# Patient Record
Sex: Female | Born: 1965 | Race: Black or African American | Hispanic: No | Marital: Single | Smoking: Current every day smoker
Health system: Southern US, Community
[De-identification: ages and names within clinical notes are randomized; demographics above are authoritative.]

## PROBLEM LIST (undated history)

## (undated) DIAGNOSIS — C539 Malignant neoplasm of cervix uteri, unspecified: Secondary | ICD-10-CM

## (undated) DIAGNOSIS — I671 Cerebral aneurysm, nonruptured: Secondary | ICD-10-CM

## (undated) HISTORY — PX: BRAIN SURGERY: SHX531

## (undated) HISTORY — PX: ABDOMINAL HYSTERECTOMY: SHX81

---

## 2011-09-29 ENCOUNTER — Encounter (HOSPITAL_BASED_OUTPATIENT_CLINIC_OR_DEPARTMENT_OTHER): Payer: Self-pay | Admitting: *Deleted

## 2011-09-29 ENCOUNTER — Emergency Department (INDEPENDENT_AMBULATORY_CARE_PROVIDER_SITE_OTHER): Payer: Self-pay

## 2011-09-29 ENCOUNTER — Emergency Department (HOSPITAL_BASED_OUTPATIENT_CLINIC_OR_DEPARTMENT_OTHER)
Admission: EM | Admit: 2011-09-29 | Discharge: 2011-09-29 | Disposition: A | Discharge status: Home / Self Care | Payer: Self-pay | Attending: Emergency Medicine | Admitting: Emergency Medicine

## 2011-09-29 DIAGNOSIS — R059 Cough, unspecified: Secondary | ICD-10-CM | POA: Insufficient documentation

## 2011-09-29 DIAGNOSIS — R509 Fever, unspecified: Secondary | ICD-10-CM

## 2011-09-29 DIAGNOSIS — J111 Influenza due to unidentified influenza virus with other respiratory manifestations: Secondary | ICD-10-CM

## 2011-09-29 DIAGNOSIS — J40 Bronchitis, not specified as acute or chronic: Secondary | ICD-10-CM

## 2011-09-29 DIAGNOSIS — Z79899 Other long term (current) drug therapy: Secondary | ICD-10-CM | POA: Insufficient documentation

## 2011-09-29 DIAGNOSIS — R05 Cough: Secondary | ICD-10-CM

## 2011-09-29 HISTORY — DX: Malignant neoplasm of cervix uteri, unspecified: C53.9

## 2011-09-29 HISTORY — DX: Cerebral aneurysm, nonruptured: I67.1

## 2011-09-29 MED ORDER — IBUPROFEN 600 MG PO TABS: 600.0000 mg | ORAL_TABLET | Freq: Four times a day (QID) | ORAL | Status: AC | PRN

## 2011-09-29 MED ORDER — AZITHROMYCIN 250 MG PO TABS: 250.0000 mg | ORAL_TABLET | Freq: Every day | ORAL | Status: AC

## 2011-09-29 MED ORDER — OXYMETAZOLINE HCL 0.05 % NA SOLN: 2.0000 | Freq: Two times a day (BID) | NASAL | Status: AC

## 2011-09-29 MED ORDER — ALBUTEROL SULFATE HFA 108 (90 BASE) MCG/ACT IN AERS: 1.0000 | INHALATION_SPRAY | Freq: Four times a day (QID) | RESPIRATORY_TRACT | Status: DC | PRN

## 2011-09-29 MED ORDER — IBUPROFEN 800 MG PO TABS
800.0000 mg | ORAL_TABLET | Freq: Once | ORAL | Status: AC
  Administered 2011-09-29: 800 mg via ORAL
  Filled 2011-09-29: qty 1

## 2011-09-29 NOTE — ED Provider Notes (Signed)
History   This chart was scribed for Forbes Cellar, MD by Charolett Bumpers . The patient was seen in room MH11/MH11 and the patient's care was started at 4:11pm.    CSN: [id number]  Arrival date & time 09/29/11  1341   First MD Initiated Contact with Patient 09/29/11 1604      Chief Complaint  Patient presents with  . Cough  . Nasal Congestion    (Consider location/radiation/quality/duration/timing/severity/associated sxs/prior treatment) HPI Courtney Cunningham is a 46 y.o. female who presents to the Emergency Department complaining of constant, moderate productive cough with green/yellowish phlegm for the past 3 days. Patient reports associated congestion, subjective fever, chills, headache, neck stiffness, n/v/d, dizziness and body aches. Patient reports that the body aches started yesterday and are described as 6/10. All other sx began 3 days ago. Patient notes that she took Phenergan for the n/v, and OTC medications (Mucinex)  for her cough and congestion. Patient denies vision changes and dysuria. Patient also notes that she did not receive a flu shot this season. +sick contacts at home.    Past Medical History  Diagnosis Date  . Cervical cancer   . Cervical cancer   . Brain aneurysm     Past Surgical History  Procedure Date  . Brain surgery   . Abdominal hysterectomy     No family history on file.  History  Substance Use Topics  . Smoking status: Current Everyday Smoker -- 1.0 packs/day  . Smokeless tobacco: Never Used  . Alcohol Use: No    OB History    Grav Para Term Preterm Abortions TAB SAB Ect Mult Living                  Review of Systems A complete 10 system review of systems was obtained and is otherwise negative except as noted in the HPI and PMH.   Allergies  Penicillins  Home Medications   Current Outpatient Rx  Name Route Sig Dispense Refill  . VITAMIN C PO Oral Take 1 tablet by mouth daily.    . CHLORPHEN-PE-ACETAMINOPHEN 2-5-500 MG PO  TABS Oral Take 2 tablets by mouth 4 (four) times daily.    . ADULT MULTIVITAMIN W/MINERALS CH Oral Take 1 tablet by mouth daily.    Marland Kitchen PHENYLEPHRINE-APAP-GUAIFENESIN 10-650-400 MG/20ML PO LIQD Oral Take 20 mLs by mouth every 4 (four) hours as needed. For cold symptoms    . TRUBIOTICS PO CAPS Oral Take 1 capsule by mouth daily.    . ALBUTEROL SULFATE HFA 108 (90 BASE) MCG/ACT IN AERS Inhalation Inhale 1-2 puffs into the lungs every 6 (six) hours as needed for wheezing. 1 Inhaler 0  . AZITHROMYCIN 250 MG PO TABS Oral Take 1 tablet (250 mg total) by mouth daily. Take first 2 tablets together, then 1 every day until finished. 6 tablet 0  . IBUPROFEN 600 MG PO TABS Oral Take 1 tablet (600 mg total) by mouth every 6 (six) hours as needed for pain. 30 tablet 0  . OXYMETAZOLINE HCL 0.05 % NA SOLN Nasal Place 2 sprays into the nose 2 (two) times daily. 30 mL 0    BP 146/77  Pulse 83  Temp(Src) 98.3 F (36.8 C) (Oral)  Resp 16  Ht 5' (1.524 m)  Wt 140 lb (63.504 kg)  BMI 27.34 kg/m2  SpO2 98%  Physical Exam  Nursing note and vitals reviewed. Constitutional: She is oriented to person, place, and time. She appears well-developed and well-nourished. No distress.  HENT:  Head: Normocephalic and atraumatic.  Right Ear: External ear normal.  Left Ear: External ear normal.       TMs normal bilaterally. Mucous membranes are normal.   Eyes: EOM are normal. Pupils are equal, round, and reactive to light.  Neck: Neck supple. No tracheal deviation present.  Cardiovascular: Normal rate, regular rhythm and normal heart sounds.  Exam reveals no gallop and no friction rub.   No murmur heard. Pulmonary/Chest: Effort normal and breath sounds normal. No respiratory distress. She exhibits no tenderness.       Lungs are clear bilaterally.   Abdominal: Soft. Bowel sounds are normal. She exhibits no distension and no mass. There is no tenderness. There is no rebound and no guarding.       No CVA tenderness.     Musculoskeletal: Normal range of motion. She exhibits no edema.  Neurological: She is alert and oriented to person, place, and time. No sensory deficit.  Skin: Skin is warm and dry.  Psychiatric: She has a normal mood and affect. Her behavior is normal.    ED Course  Procedures (including critical care time)   COORDINATION OF CARE:  1630: Medication Orders: Ibuprofen tablet 800 mg.    Labs Reviewed - No data to display Dg Chest 2 View  09/29/2011  *RADIOLOGY REPORT*  Clinical Data: Cough and fever  CHEST - 2 VIEW  Comparison: None  Findings: The heart size and mediastinal contours are within normal limits.  Both lungs are clear.  The visualized skeletal structures are unremarkable.  IMPRESSION: No active cardiopulmonary abnormalities.  Original Report Authenticated By: Rosealee Albee, M.D.     1. Influenza-like illness   2. Bronchitis     MDM  Patient pw flu like illness, worsening cough/chest congestion. No wheezing at this time. CXR negative pna. Azithromycin, albuterol for cough, afrin nasal spray. Sx > 3 days and patient appears well. Immunocompetent. No tamiflu prescribed at this time. Will discharge home with PMD referral. Patient comfortable with the plan. Given precautions for return.  I personally performed the services described in this documentation, which was scribed in my presence. The recorded information has been reviewed and considered.       Forbes Cellar, MD 09/29/11 [phone]

## 2011-09-29 NOTE — ED Notes (Signed)
C/o cough, congestion, headache and chills x 3 days

## 2013-01-19 IMAGING — CR DG CHEST 2V
2 series · 2 of 2 positions shown · non-contrast
Comparison: None

CLINICAL DATA: Cough and fever

CHEST - 2 VIEW

[w chest pa]
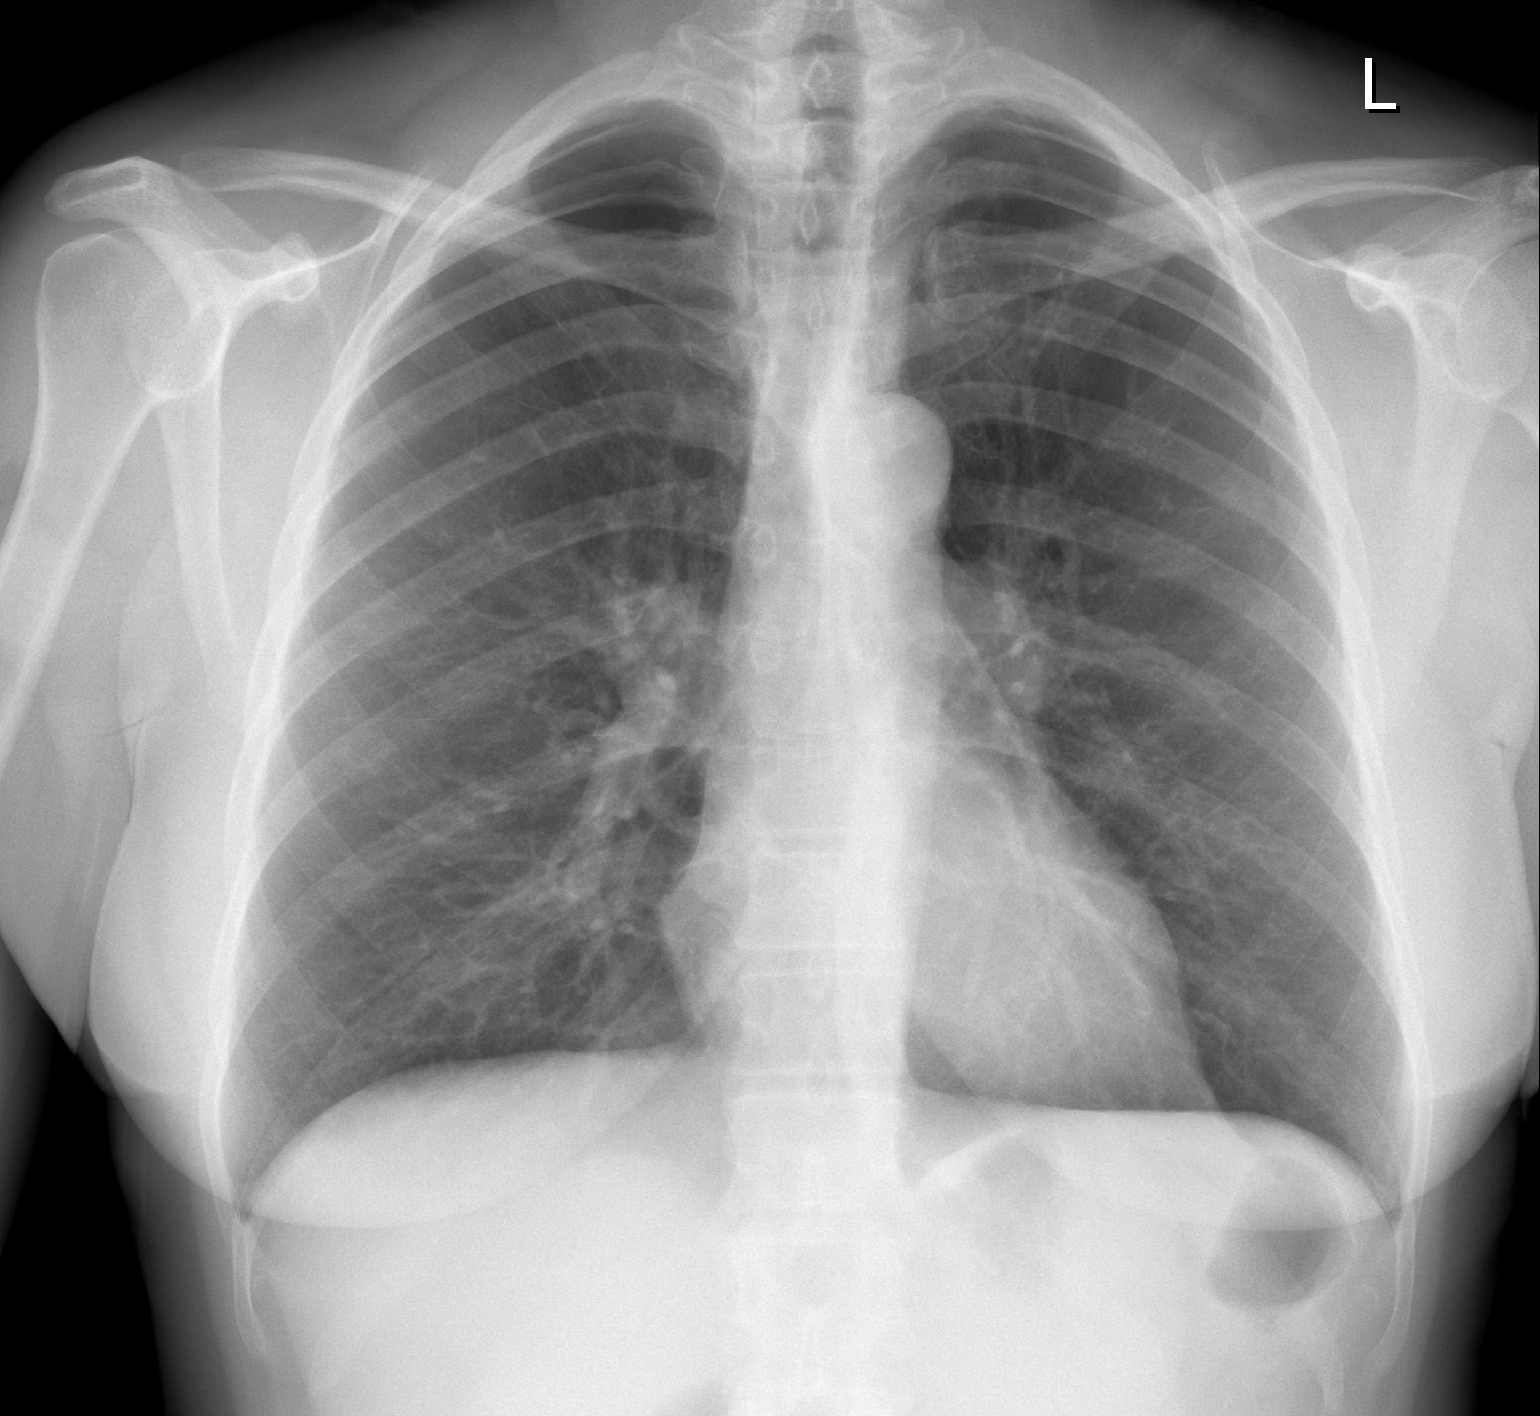

[w chest lat]
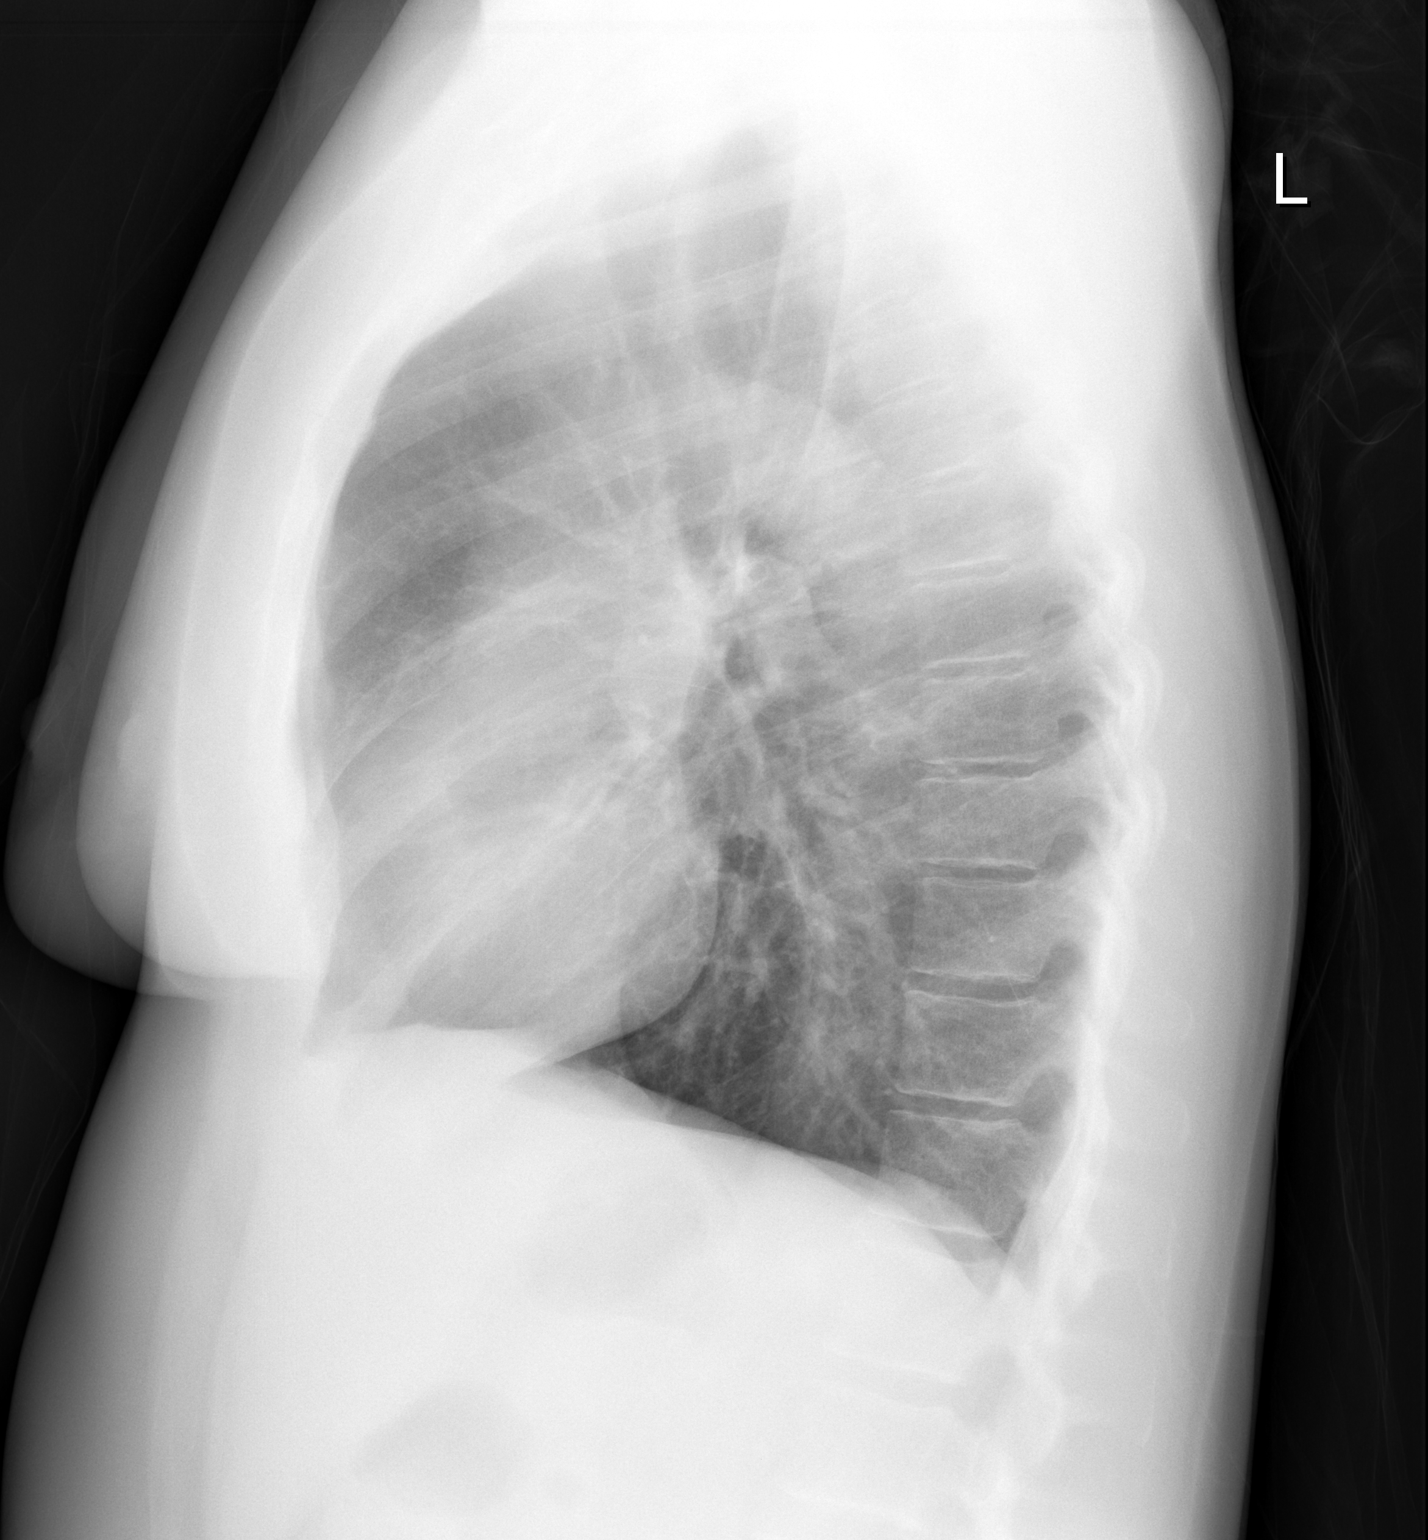

[2 of 2 positions shown; findings below may reference images not displayed]

FINDINGS: The heart size and mediastinal contours are within normal
limits.  Both lungs are clear.  The visualized skeletal structures
are unremarkable.
IMPRESSION: No active cardiopulmonary abnormalities.

## 2013-11-10 ENCOUNTER — Encounter (HOSPITAL_COMMUNITY): Payer: Self-pay | Admitting: Emergency Medicine

## 2013-11-10 ENCOUNTER — Emergency Department (HOSPITAL_COMMUNITY)
Admission: EM | Admit: 2013-11-10 | Discharge: 2013-11-10 | Disposition: A | Discharge status: Home / Self Care | Payer: No Typology Code available for payment source | Attending: Emergency Medicine | Admitting: Emergency Medicine

## 2013-11-10 DIAGNOSIS — Z8541 Personal history of malignant neoplasm of cervix uteri: Secondary | ICD-10-CM | POA: Insufficient documentation

## 2013-11-10 DIAGNOSIS — R638 Other symptoms and signs concerning food and fluid intake: Secondary | ICD-10-CM | POA: Insufficient documentation

## 2013-11-10 DIAGNOSIS — Z79899 Other long term (current) drug therapy: Secondary | ICD-10-CM | POA: Insufficient documentation

## 2013-11-10 DIAGNOSIS — R5381 Other malaise: Secondary | ICD-10-CM | POA: Insufficient documentation

## 2013-11-10 DIAGNOSIS — Z880 Allergy status to penicillin: Secondary | ICD-10-CM | POA: Insufficient documentation

## 2013-11-10 DIAGNOSIS — R5383 Other fatigue: Secondary | ICD-10-CM

## 2013-11-10 DIAGNOSIS — R634 Abnormal weight loss: Secondary | ICD-10-CM | POA: Insufficient documentation

## 2013-11-10 DIAGNOSIS — F17200 Nicotine dependence, unspecified, uncomplicated: Secondary | ICD-10-CM | POA: Insufficient documentation

## 2013-11-10 LAB — CBG MONITORING, ED: Glucose-Capillary: 85 mg/dL (ref 70–99)

## 2013-11-10 LAB — BASIC METABOLIC PANEL
BUN: 13 mg/dL (ref 6–23)
CALCIUM: 10.2 mg/dL (ref 8.4–10.5)
CO2: 27 meq/L (ref 19–32)
Chloride: 102 mEq/L (ref 96–112)
Creatinine, Ser: 0.74 mg/dL (ref 0.50–1.10)
GFR calc Af Amer: >90 mL/min (ref >90)
Glucose, Bld: 90 mg/dL (ref 70–99)
Potassium: 4.6 mEq/L (ref 3.7–5.3)
SODIUM: 141 meq/L (ref 137–147)

## 2013-11-10 LAB — CBC
HCT: 41.3 % (ref 36.0–46.0)
Hemoglobin: 14.3 g/dL (ref 12.0–15.0)
MCH: 29.9 pg (ref 26.0–34.0)
MCHC: 34.6 g/dL (ref 30.0–36.0)
MCV: 86.4 fL (ref 78.0–100.0)
PLATELETS: 308 10*3/uL (ref 150–400)
RBC: 4.78 MIL/uL (ref 3.87–5.11)
RDW: 13.1 % (ref 11.5–15.5)
WBC: 6.8 10*3/uL (ref 4.0–10.5)

## 2013-11-10 LAB — I-STAT CHEM 8, ED
BUN: 13 mg/dL (ref 6–23)
CREATININE: 0.9 mg/dL (ref 0.50–1.10)
Calcium, Ion: 1.25 mmol/L — ABNORMAL HIGH (ref 1.12–1.23)
Chloride: 104 mEq/L (ref 96–112)
Glucose, Bld: 85 mg/dL (ref 70–99)
HEMATOCRIT: 45 % (ref 36.0–46.0)
HEMOGLOBIN: 15.3 g/dL — AB (ref 12.0–15.0)
POTASSIUM: 5.4 meq/L — AB (ref 3.7–5.3)
SODIUM: 142 meq/L (ref 137–147)
TCO2: 28 mmol/L (ref 0–100)

## 2013-11-10 NOTE — ED Provider Notes (Signed)
CSN: [id number]     Arrival date & time 11/10/13  1228 History   First MD Initiated Contact with Patient 11/10/13 1324     Chief Complaint  Patient presents with  . Fatigue  . Weight Loss     (Consider location/radiation/quality/duration/timing/severity/associated sxs/prior Treatment) HPI Comments: Patient presents to the ED with a chief complaint of fatigue.  She states that over the past 3 months she has noticed great amount of fatigue and loss of appetite which has resulted in approx 15lbs weight loss.  Pt states that she had cervical cancer and hysterectomy in 2009.  Pt then lost employment and her insurance so wasn't able to afford her follow care. She states that she is worried about possibility of cancer returning causing these symptoms.  Patient denies fevers, chills, headache, blurred vision, new hearing loss, sore throat, chest pain, shortness of breath, nausea, vomiting, diarrhea, constipation, dysuria, peripheral edema, back pain, numbness or tingling of the extremities.     The history is provided by the patient. No language interpreter was used.    Past Medical History  Diagnosis Date  . Cervical cancer   . Cervical cancer   . Brain aneurysm    Past Surgical History  Procedure Laterality Date  . Brain surgery    . Abdominal hysterectomy     No family history on file. History  Substance Use Topics  . Smoking status: Current Every Day Smoker -- 1.00 packs/day    Types: Cigarettes  . Smokeless tobacco: Never Used  . Alcohol Use: No   OB History   Grav Para Term Preterm Abortions TAB SAB Ect Mult Living                 Review of Systems  All other systems reviewed and are negative.      Allergies  Penicillins  Home Medications   Current Outpatient Rx  Name  Route  Sig  Dispense  Refill  . Ascorbic Acid (VITAMIN C PO)   Oral   Take 1 tablet by mouth daily.         Marland Kitchen ibuprofen (ADVIL,MOTRIN) 200 MG tablet   Oral   Take 600 mg by mouth every 6  (six) hours as needed (pain).         . Multiple Vitamin (MULITIVITAMIN WITH MINERALS) TABS   Oral   Take 1 tablet by mouth daily.          BP 143/85  Pulse 78  Temp(Src) 98.5 F (36.9 C) (Oral)  Resp 14  Ht 5' (1.524 m)  Wt 106 lb (48.081 kg)  BMI 20.70 kg/m2  SpO2 100% Physical Exam  Nursing note and vitals reviewed. Constitutional: She is oriented to person, place, and time. She appears well-developed and well-nourished.  HENT:  Head: Normocephalic and atraumatic.  Eyes: Conjunctivae and EOM are normal. Pupils are equal, round, and reactive to light.  Neck: Normal range of motion. Neck supple.  Cardiovascular: Normal rate and regular rhythm.  Exam reveals no gallop and no friction rub.   No murmur heard. Pulmonary/Chest: Effort normal and breath sounds normal. No respiratory distress. She has no wheezes. She has no rales. She exhibits no tenderness.  CTAB  Abdominal: Soft. Bowel sounds are normal. She exhibits no distension and no mass. There is no tenderness. There is no rebound and no guarding.  No focal abdominal tenderness, no RLQ tenderness or pain at McBurney's point, no RUQ tenderness or Murphy's sign, no left-sided abdominal tenderness, no fluid wave,  or signs of peritonitis   Musculoskeletal: Normal range of motion. She exhibits no edema and no tenderness.  Neurological: She is alert and oriented to person, place, and time.  Skin: Skin is warm and dry.  Psychiatric: She has a normal mood and affect. Her behavior is normal. Judgment and thought content normal.    ED Course  Procedures (including critical care time) Results for orders placed during the hospital encounter of 11/10/13  CBC      Result Value Ref Range   WBC 6.8  4.0 - 10.5 K/uL   RBC 4.78  3.87 - 5.11 MIL/uL   Hemoglobin 14.3  12.0 - 15.0 g/dL   HCT 41.3  36.0 - 46.0 %   MCV 86.4  78.0 - 100.0 fL   MCH 29.9  26.0 - 34.0 pg   MCHC 34.6  30.0 - 36.0 g/dL   RDW 13.1  11.5 - 15.5 %   Platelets  308  150 - 400 K/uL  BASIC METABOLIC PANEL      Result Value Ref Range   Sodium 141  137 - 147 mEq/L   Potassium 4.6  3.7 - 5.3 mEq/L   Chloride 102  96 - 112 mEq/L   CO2 27  19 - 32 mEq/L   Glucose, Bld 90  70 - 99 mg/dL   BUN 13  6 - 23 mg/dL   Creatinine, Ser 0.74  0.50 - 1.10 mg/dL   Calcium 10.2  8.4 - 10.5 mg/dL   GFR calc non Af Amer >90  >90 mL/min   GFR calc Af Amer >90  >90 mL/min  CBG MONITORING, ED      Result Value Ref Range   Glucose-Capillary 85  70 - 99 mg/dL  I-STAT CHEM 8, ED      Result Value Ref Range   Sodium 142  137 - 147 mEq/L   Potassium 5.4 (*) 3.7 - 5.3 mEq/L   Chloride 104  96 - 112 mEq/L   BUN 13  6 - 23 mg/dL   Creatinine, Ser 0.90  0.50 - 1.10 mg/dL   Glucose, Bld 85  70 - 99 mg/dL   Calcium, Ion 1.25 (*) 1.12 - 1.23 mmol/L   TCO2 28  0 - 100 mmol/L   Hemoglobin 15.3 (*) 12.0 - 15.0 g/dL   HCT 45.0  36.0 - 46.0 %       EKG Interpretation None     ED ECG REPORT  I personally interpreted this EKG   Date: 11/10/2013   Rate: 76  Rhythm: normal sinus rhythm  QRS Axis: normal  Intervals: normal  ST/T Wave abnormalities: normal  Conduction Disutrbances:none  Narrative Interpretation:   Old EKG Reviewed: none available    MDM   Final diagnoses:  Fatigue    Patient wanted to be evaluated for fatigue for the past 3 months. She states that she recently got her insurance back, and wanted to be evaluated. She states that she is nervous that she has cancer. She is in no apparent distress now. She does not have any pain. She states that she has not been sleeping well because of her anxiety. I suspect that this is the cause of her fatigue. I had a 10-15 minute conversation with patient regarding how to obtain followup in the future. We discussed following up with a PCP and with OB/GYN. Her labs are unremarkable in the ER today.  She is stable and not in any apparent distress.  I will discharge to  home with PCP/OBGYN follow-up.  Return precautions  are given.  Patient is stable and ready for discharge.   Chem 8 shows hyperkalemia. Suspect hemolysis.  Rechecked on BMP.  K is 4.6.    Montine Circle, PA-C 11/10/13 1358

## 2013-11-10 NOTE — ED Notes (Signed)
Pt states that over the past 3 months she has noticed great amount of fatigue and loss of appetite which has resulted in approx 15lbs weight loss.  Pt states that she had cervical cancer and hysterectomy in 2009.  Pt then lost employment and her insurance so wasn't able to afford her follow care. pT is worried about possibility of cancer returning causing these symptoms.

## 2013-11-10 NOTE — ED Provider Notes (Signed)
  Medical screening examination/treatment/procedure(s) were performed by non-physician practitioner and as supervising physician I was immediately available for consultation/collaboration.  I sent the EKG, sinus rhythm, rate 76, unremarkable   Carmin Muskrat, MD 11/10/13 1535

## 2013-11-10 NOTE — Discharge Instructions (Signed)
Fatigue °Fatigue is a feeling of tiredness, lack of energy, lack of motivation, or feeling tired all the time. Having enough rest, good nutrition, and reducing stress will normally reduce fatigue. Consult your caregiver if it persists. The nature of your fatigue will help your caregiver to find out its cause. The treatment is based on the cause.  °CAUSES  °There are many causes for fatigue. Most of the time, fatigue can be traced to one or more of your habits or routines. Most causes fit into one or more of three general areas. They are: °Lifestyle problems °· Sleep disturbances. °· Overwork. °· Physical exertion. °· Unhealthy habits. °· Poor eating habits or eating disorders. °· Alcohol and/or drug use . °· Lack of proper nutrition (malnutrition). °Psychological problems °· Stress and/or anxiety problems. °· Depression. °· Grief. °· Boredom. °Medical Problems or Conditions °· Anemia. °· Pregnancy. °· Thyroid gland problems. °· Recovery from major surgery. °· Continuous pain. °· Emphysema or asthma that is not well controlled °· Allergic conditions. °· Diabetes. °· Infections (such as mononucleosis). °· Obesity. °· Sleep disorders, such as sleep apnea. °· Heart failure or other heart-related problems. °· Cancer. °· Kidney disease. °· Liver disease. °· Effects of certain medicines such as antihistamines, cough and cold remedies, prescription pain medicines, heart and blood pressure medicines, drugs used for treatment of cancer, and some antidepressants. °SYMPTOMS  °The symptoms of fatigue include:  °· Lack of energy. °· Lack of drive (motivation). °· Drowsiness. °· Feeling of indifference to the surroundings. °DIAGNOSIS  °The details of how you feel help guide your caregiver in finding out what is causing the fatigue. You will be asked about your present and past health condition. It is important to review all medicines that you take, including prescription and non-prescription items. A thorough exam will be done.  You will be questioned about your feelings, habits, and normal lifestyle. Your caregiver may suggest blood tests, urine tests, or other tests to look for common medical causes of fatigue.  °TREATMENT  °Fatigue is treated by correcting the underlying cause. For example, if you have continuous pain or depression, treating these causes will improve how you feel. Similarly, adjusting the dose of certain medicines will help in reducing fatigue.  °HOME CARE INSTRUCTIONS  °· Try to get the required amount of good sleep every night. °· Eat a healthy and nutritious diet, and drink enough water throughout the day. °· Practice ways of relaxing (including yoga or meditation). °· Exercise regularly. °· Make plans to change situations that cause stress. Act on those plans so that stresses decrease over time. Keep your work and personal routine reasonable. °· Avoid street drugs and minimize use of alcohol. °· Start taking a daily multivitamin after consulting your caregiver. °SEEK MEDICAL CARE IF:  °· You have persistent tiredness, which cannot be accounted for. °· You have fever. °· You have unintentional weight loss. °· You have headaches. °· You have disturbed sleep throughout the night. °· You are feeling sad. °· You have constipation. °· You have dry skin. °· You have gained weight. °· You are taking any new or different medicines that you suspect are causing fatigue. °· You are unable to sleep at night. °· You develop any unusual swelling of your legs or other parts of your body. °SEEK IMMEDIATE MEDICAL CARE IF:  °· You are feeling confused. °· Your vision is blurred. °· You feel faint or pass out. °· You develop severe headache. °· You develop severe abdominal, pelvic, or   back pain. °· You develop chest pain, shortness of breath, or an irregular or fast heartbeat. °· You are unable to pass a normal amount of urine. °· You develop abnormal bleeding such as bleeding from the rectum or you vomit blood. °· You have thoughts  about harming yourself or committing suicide. °· You are worried that you might harm someone else. °MAKE SURE YOU:  °· Understand these instructions. °· Will watch your condition. °· Will get help right away if you are not doing well or get worse. °Document Released: 06/17/2007 Document Revised: 11/12/2011 Document Reviewed: 06/17/2007 °ExitCare® Patient Information ©2014 [employer]. ° ° ° °Emergency Department Resource Guide °1) Find a Doctor and Pay Out of Pocket °Although you won't have to find out who is covered by your insurance plan, it is a good idea to ask around and get recommendations. You will then need to call the office and see if the doctor you have chosen will accept you as a new patient and what types of options they offer for patients who are self-pay. Some doctors offer discounts or will set up payment plans for their patients who do not have insurance, but you will need to ask so you aren't surprised when you get to your appointment. ° °2) Contact Your Local Health Department °Not all health departments have doctors that can see patients for sick visits, but many do, so it is worth a call to see if yours does. If you don't know where your local health department is, you can check in your phone book. The CDC also has a tool to help you locate your state's health department, and many state websites also have listings of all of their local health departments. ° °3) Find a Walk-in Clinic °If your illness is not likely to be very severe or complicated, you may want to try a walk in clinic. These are popping up all over the country in pharmacies, drugstores, and shopping centers. They're usually staffed by nurse practitioners or physician assistants that have been trained to treat common illnesses and complaints. They're usually fairly quick and inexpensive. However, if you have serious medical issues or chronic medical problems, these are probably not your best option. ° °No Primary Care  Doctor: °- Call [facility] at  832-8000 - they can help you locate a primary care doctor that  accepts your insurance, provides certain services, etc. °- Physician Referral Service- 1-800-533-3463 ° °Chronic Pain Problems: °Organization         Address  Phone   Notes  °[facility]  (336) 297-2271 Patients need to be referred by their primary care doctor.  ° °Medication Assistance: °Organization         Address  Phone   Notes  °[place] [facility] [address] °[place], [place] [address] (336) 641-8030 --Must be a resident of [place] °-- Must have NO insurance coverage whatsoever (no Medicaid/ Medicare, etc.) °-- The pt. MUST have a primary care doctor that directs their care regularly and follows them in the community °  °[facility]  (866) 331-1348   °[employer]  (888) 892-1162   ° °Agencies that provide inexpensive medical care: °Organization         Address  Phone   Notes  °[facility]  (336) 832-8035   °[facility]    (336) 832-7272   °[facility] [address] °[place], [place] [address] (336) 832-4777   °  [facility] [address], °[place] (336) 271-4999   °[facility]    (336) 373-0678   °[facility]    (336) 272-1050   °[facility] ° [address], [place] Phone:  (336) 832-4444, Fax:  (336) 832-4440 Hours of Operation:  9 am - 6 pm, M-F.  Also accepts Medicaid/Medicare and self-pay.  ° [facility] ° [address], [place] Phone: (336) 832-3150, Fax: (336) 832-3151. Hours of Operation:  8:30 am - 5:30 pm, M-F.  Also accepts Medicaid and self-pay.  °[facility] [place] [address], [place] Phone: (336) 878-6027   °[facility] [address], [place], [place] (336)723-1848, Ext. 123 Mondays & Thursdays: 7-9 AM.  First 15 patients are seen on a first  come, first serve basis. °  ° °Medicaid-accepting [place] Providers: ° °Organization         Address  Phone   Notes  °[facility] [address], [place] (336) 641-2100 Also accepts self-pay patients.  °[facility] [address], [place] ° (336) 856-9996   °[facility] [address], [place] (336) 288-8857   °[facility] [address] [place] [address], [place] (336) 299-7000   °Veita Bland [address], [place]  ° (336) 373-1557 Only accepts [place] Access Medicaid patients after they have their name applied to their card.  ° °Self-Pay (no insurance) in [place]: ° °Organization         Address  Phone   Notes  °Sickle Cell Patients, [facility] [address], [place] (336) 832-1970   °[facility] [address], [place] (336) 832-4400   °[facility] ° [address], [place] (336) 992-4800   °[facility]/Dr. Osei-Bonsu ° [address] [place] [address], [place] or [address], [place] (336) 841-8500 Phone number for both [place] and [place] locations is the same.  °[facility] [address], [employer] [employer] (336) 299-0000   °[facility] [address] [place] [address], [place] or [address] (336) 852-7530 °(336) 878-2260   °[facility] [address], [place] (336) 350-1642, phone; (336) 294-5005, fax Sees patients 1st and 3rd Saturday of every month.  Must not qualify for public or private insurance (i.e. Medicaid, Medicare, [place] Health Choice, Veterans' Benefits) • Household income should be no more than 200% of the poverty level •The clinic cannot treat you if you are pregnant or think you are pregnant • Sexually transmitted diseases are not treated at the clinic.  ° ° °Dental Care: °Organization          Address  Phone  Notes  °[facility] [address],  (336) 641-6152 Accepts children up to age 21 who are enrolled in Medicaid or [place] Health Choice; pregnant women with a Medicaid card; and children who have applied for Medicaid or [place] Health Choice, but were declined, whose parents can pay a reduced fee at time of service.  °[facility] [place]  [address], [place] (336) 641-7733 Accepts children up to age 21 who are enrolled in Medicaid or [place] Health Choice; pregnant women with a Medicaid card; and   children who have applied for Medicaid or [place] Health Choice, but were declined, whose parents can pay a reduced fee at time of service.  °[facility] ° [address], [place] (336) 641-4533 Patients are seen by appointment only. Walk-ins are not accepted. [facility] will see patients 18 years of age and older. °Monday - Tuesday (8am-5pm) °Most Wednesdays (8:30-5pm) °$30 per visit, cash only  °[facility] ° [address], [place] (336) 641-4533 Patients are seen by appointment only. Walk-ins are not accepted. [facility] will see patients 18 years of age and older. °One Wednesday Evening (Monthly: Volunteer Based).  $30 per visit, cash only  °[facility]  (919) 537-3737 for adults; Children under age 4, call Graduate Pediatric Dentistry at ([phone]) [phone]. Children aged 4-14, please call ([phone]) [phone] to request a pediatric application. ° Dental services are provided in all areas of dental care including fillings, crowns and bridges, complete and partial dentures, implants, gum treatment, root canals, and extractions. Preventive care is also provided. Treatment is provided to both adults and children. °Patients are selected via a lottery and there is often a waiting list. °  °[facility] [address], °[place] ° (336) 763-8833 [email or web address] °  °[facility] [address], [place], [place] (336)723-1848, Ext. 123 Second and Fourth Thursday of each month, opens at 6:30 AM; Clinic ends at 9 AM.  Patients are seen on a first-come first-served basis, and a limited number are seen during each clinic.  ° °[facility] ° [address], [place], [place] (336) 723-7904   Eligibility Requirements °You must have lived in [place], [place], or [place] counties for at least the last three months. °  You cannot be eligible for state or federal sponsored healthcare insurance, including Veterans Administration, Medicaid, or Medicare. °  You generally cannot be eligible for healthcare insurance through your employer.  °  How to apply: °Eligibility screenings are held every Tuesday and Wednesday afternoon from 1:00 pm until 4:00 pm. You do not need an appointment for the interview!  °[facility] [address], [place], [place] 336-631-2330   °[facility]  336-342-8273   °[facility]  336-703-3100   °[facility]  336-570-6415   ° °Behavioral Health Resources in the Community: °Intensive Outpatient Programs °Organization         Address  Phone  Notes  °[facility] [address], [place], [place] 336-878-6098   °[facility] [address], [place], [place] 336-832-9800   °[facility] [address], [place], [place] ° 336-882-2125   °[facility] [address],  °[place],  1-800-853-5163 or 336-641-4981   °Substance Abuse Resources °Organization         Address  Phone  Notes  °[facility]  336-882-2125   °[facility]  336-784-9470   °[facility]  336-285-9073   °[facility]  336-845-3988   °Residential & Outpatient Substance Abuse Program  1-800-659-3381   °Psychological  Services °Organization         Address  Phone  Notes  °[facility]  336- 832-9600   °[facility]  336- 378-7881   °[facility] [address], [place] 1-800-853-5163 or 336-641-4981   ° °Mobile Crisis Teams °Organization           Address  Phone  Notes  °[facility], Mobile Crisis Care Unit  1-877-626-1772   °Assertive °[facility] ° [address] [place], [place] 336-834-9664   °Sharon DeEsch [address] °[place] 336-554-5454   ° °Self-Help/Support Groups °Organization         Address  Phone             Notes  °[facility] - variety of support groups  336- 373-1402 Call for more information  °Narcotics Anonymous (NA), [facility] [address], °[place]  2 meetings at this location  ° °Residential Treatment Programs °Organization         Address  Phone  Notes  °[facility] [address],    °[place]  1-866-801-8205   °[facility] ° [address], [place], [place] 704-293-8524   °[facility] [address], [place] 336-845-3988 Admissions: 8am-3pm M-F  °[facility] [address]    °[place], [place] 336-841-1104   °[facility] [address], [place], [place] 336-379-7146   °[facility] [address]  °[place], [place] 336-285-9073   °[facility] - Intensive Outpatient [address], [place], [place] 336-852-3033   °[facility] [address]  °[place], [place] 1-877-615-2722 or 336-784-9470   °[facility] [address] [place], [place] 336-227-7417 Accepts Medicaid  °[facility] [address]  °[place] 1-800-659-3381 Substance Abuse/Addiction Treatment  ° °[place] Behavioral Health Resources °Organization         Address  Phone  Notes  °[facility]  (888)  581-9988   °Julie Brannon, PhD [address] [place], [place]   (336) 349-5553 or (336) 951-0000   °[facility]   [address] °[place], [place] (336) 349-4454   °[facility] [address], [place], [place] (336) 342-8316 Insurance/Medicaid/sponsorship through [facility]  °[facility] [address]                                    [place], [place] (336) 342-8316 Therapy/tele-psych/case  °[facility] [address]  ° [place], [place] (336) 349-2233    °Dr. Arfeen  (336) 349-4544   °[facility] [place]  [employer] [place] [facility] 1) [address], [place] °2) [address], [place] °3)  [address], [place] (336) 349-3220 °(336) 342-7768 ° °(336) 342-8140   °[place] [facility] (336) 342-1394 or (336) 342-3537 (After Hours)    ° °  °

## 2013-12-30 ENCOUNTER — Ambulatory Visit: Payer: PRIVATE HEALTH INSURANCE | Admitting: Internal Medicine

## 2013-12-30 DIAGNOSIS — Z0000 Encounter for general adult medical examination without abnormal findings: Secondary | ICD-10-CM
